# Patient Record
Sex: Male | Born: 1975 | Race: White | Hispanic: No | Marital: Single | Smoking: Former smoker
Health system: Southern US, Community
[De-identification: ages and names within clinical notes are randomized; demographics above are authoritative.]

---

## 2011-12-22 ENCOUNTER — Encounter (HOSPITAL_BASED_OUTPATIENT_CLINIC_OR_DEPARTMENT_OTHER): Payer: Self-pay | Admitting: *Deleted

## 2011-12-22 ENCOUNTER — Emergency Department (HOSPITAL_BASED_OUTPATIENT_CLINIC_OR_DEPARTMENT_OTHER)
Admission: EM | Admit: 2011-12-22 | Discharge: 2011-12-22 | Disposition: A | Discharge status: Home / Self Care | Payer: Self-pay | Attending: Emergency Medicine | Admitting: Emergency Medicine

## 2011-12-22 ENCOUNTER — Emergency Department (HOSPITAL_BASED_OUTPATIENT_CLINIC_OR_DEPARTMENT_OTHER): Payer: Self-pay

## 2011-12-22 DIAGNOSIS — L03221 Cellulitis of neck: Secondary | ICD-10-CM

## 2011-12-22 DIAGNOSIS — E119 Type 2 diabetes mellitus without complications: Secondary | ICD-10-CM | POA: Insufficient documentation

## 2011-12-22 DIAGNOSIS — L0211 Cutaneous abscess of neck: Secondary | ICD-10-CM | POA: Insufficient documentation

## 2011-12-22 DIAGNOSIS — F17200 Nicotine dependence, unspecified, uncomplicated: Secondary | ICD-10-CM | POA: Insufficient documentation

## 2011-12-22 LAB — CBC WITH DIFFERENTIAL/PLATELET
Basophils Absolute: 0 10*3/uL (ref 0.0–0.1)
Basophils Relative: 0 % (ref 0–1)
Eosinophils Absolute: 0.4 10*3/uL (ref 0.0–0.7)
Eosinophils Relative: 7 % — ABNORMAL HIGH (ref 0–5)
HCT: 43.1 % (ref 39.0–52.0)
Hemoglobin: 15.9 g/dL (ref 13.0–17.0)
MCH: 30.3 pg (ref 26.0–34.0)
MCHC: 36.9 g/dL — ABNORMAL HIGH (ref 30.0–36.0)
MCV: 82.3 fL (ref 78.0–100.0)
Monocytes Absolute: 0.4 10*3/uL (ref 0.1–1.0)
Monocytes Relative: 7 % (ref 3–12)
RDW: 11.9 % (ref 11.5–15.5)

## 2011-12-22 LAB — BASIC METABOLIC PANEL
BUN: 15 mg/dL (ref 6–23)
Calcium: 9.8 mg/dL (ref 8.4–10.5)
Chloride: 97 mEq/L (ref 96–112)
Creatinine, Ser: 0.9 mg/dL (ref 0.50–1.35)
GFR calc Af Amer: >90 mL/min (ref >90)
GFR calc non Af Amer: >90 mL/min (ref >90)

## 2011-12-22 MED ORDER — OXYCODONE-ACETAMINOPHEN 5-325 MG PO TABS
2.0000 | ORAL_TABLET | Freq: Once | ORAL | Status: AC
  Administered 2011-12-22: 2 via ORAL
  Filled 2011-12-22 (×2): qty 2

## 2011-12-22 MED ORDER — HYDROMORPHONE HCL PF 1 MG/ML IJ SOLN
1.0000 mg | Freq: Once | INTRAMUSCULAR | Status: AC
  Administered 2011-12-22: 1 mg via INTRAVENOUS
  Filled 2011-12-22: qty 1

## 2011-12-22 MED ORDER — CLINDAMYCIN HCL 150 MG PO CAPS: 150.0000 mg | ORAL_CAPSULE | Freq: Four times a day (QID) | ORAL | Status: DC

## 2011-12-22 MED ORDER — OXYCODONE-ACETAMINOPHEN 5-325 MG PO TABS: 2.0000 | ORAL_TABLET | ORAL | Status: DC | PRN

## 2011-12-22 MED ORDER — CLINDAMYCIN PHOSPHATE 600 MG/50ML IV SOLN
600.0000 mg | Freq: Once | INTRAVENOUS | Status: AC
  Administered 2011-12-22: 600 mg via INTRAVENOUS
  Filled 2011-12-22: qty 50

## 2011-12-22 MED ORDER — LIDOCAINE HCL 2 % IJ SOLN
INTRAMUSCULAR | Status: AC
  Filled 2011-12-22: qty 20

## 2011-12-22 NOTE — ED Notes (Signed)
The I & D tray is setup at bedside and ready for the doctor to use.

## 2011-12-22 NOTE — ED Provider Notes (Signed)
History     CSN: [id number]  Arrival date & time 12/22/11  1348   First MD Initiated Contact with Patient 12/22/11 1531      Chief Complaint  Patient presents with  . Abscess    (Consider location/radiation/quality/duration/timing/severity/associated sxs/prior treatment) Patient is a 36 y.o. male presenting with rash. The history is provided by the patient. No language interpreter was used.  Rash  This is a new problem. There has been no fever. The pain is at a severity of 8/10. The pain is moderate. The pain has been constant since onset. Associated symptoms include pain. He has tried nothing for the symptoms. The treatment provided moderate relief.  Pt complains of a large swollen area to right neck.  Pt was seen in ED in [place] and had I and d.  Pt is on bactrim.   Pt reports increased swelling.  Pt is diabetic  Past Medical History  Diagnosis Date  . Diabetes mellitus     History reviewed. No pertinent past surgical history.  History reviewed. No pertinent family history.  History  Substance Use Topics  . Smoking status: Current Every Day Smoker  . Smokeless tobacco: Not on file  . Alcohol Use: Yes      Review of Systems  Skin: Positive for wound.  All other systems reviewed and are negative.    Allergies  Review of patient's allergies indicates no known allergies.  Home Medications   Current Outpatient Rx  Name Route Sig Dispense Refill  . INSULIN INFUSION PUMP DEVI Does not apply 1.35 Units by Does not apply route every hour as needed.     . SULFAMETHOXAZOLE-TRIMETHOPRIM 800-160 MG PO TABS Oral Take 1 tablet by mouth 2 (two) times daily.      BP 124/78  Pulse 84  Temp 97.5 F (36.4 C) (Oral)  Resp 20  Ht 6\' 3"  (1.905 m)  Wt 159 lb (72.122 kg)  BMI 19.87 kg/m2  SpO2 100%  Physical Exam  Nursing note and vitals reviewed. Constitutional: He is oriented to person, place, and time. He appears well-developed and well-nourished.  HENT:  Head:  Normocephalic and atraumatic.  Right Ear: External ear normal.  Left Ear: External ear normal.  Nose: Nose normal.  Mouth/Throat: Oropharynx is clear and moist.  Eyes: Conjunctivae normal and EOM are normal. Pupils are equal, round, and reactive to light.  Neck:       3cm swollen red area right neck  Cardiovascular: Normal rate and normal heart sounds.   Pulmonary/Chest: Effort normal and breath sounds normal.  Musculoskeletal: Normal range of motion.  Lymphadenopathy:    He has no cervical adenopathy.  Neurological: He is alert and oriented to person, place, and time. He has normal reflexes.  Skin: Skin is warm.  Psychiatric: He has a normal mood and affect.    ED Course  INCISION AND DRAINAGE Date/Time: 12/22/2011 8:26 PM Performed by: Elson Areas Authorized by: Elson Areas Consent: Verbal consent obtained. Risks and benefits: risks, benefits and alternatives were discussed Consent given by: patient Patient understanding: patient states understanding of the procedure being performed Required items: required blood products, implants, devices, and special equipment available Patient identity confirmed: verbally with patient Time out: Immediately prior to procedure a "time out" was called to verify the correct patient, procedure, equipment, support staff and site/side marked as required. Type: abscess Anesthesia: local infiltration Risk factor: underlying major vessel Scalpel size: 11 Incision type: single straight Complexity: simple Patient tolerance: Patient tolerated the procedure  well with no immediate complications. Comments: I did I and D with ultrasound,  No drainage.   Dr. Rhunette Croft made a larger incision with no drainge.  Packing placed   (including critical care time)  Labs Reviewed  GLUCOSE, CAPILLARY - Abnormal; Notable for the following:    Glucose-Capillary 252 (*)     All other components within normal limits  CBC WITH DIFFERENTIAL - Abnormal; Notable  for the following:    MCHC 36.9 (*)     Eosinophils Relative 7 (*)     All other components within normal limits  BASIC METABOLIC PANEL - Abnormal; Notable for the following:    Glucose, Bld 183 (*)     All other components within normal limits   US Soft Tissue Head/neck  12/22/2011  *RADIOLOGY REPORT*  Clinical Data: 36 year old male with focal swelling in the superficial right lateral neck could.  Attempted incision and drainage without fluid or pus identified.  ULTRASOUND OF HEAD/NECK SOFT TISSUES  Technique:  Ultrasound examination of the head and neck soft tissues was performed in the area of clinical concern.  Comparison:  None  Findings: Targeted evaluation of the lateral right neck in the area of clinical concern demonstrates a superficial heterogeneous area measuring approximately 1.3 x 2.5 x 2 cm.  There is no evidence of focal collections/fluid.  An area of shadowing in the midst of this heterogeneity is compatible with gauze placed following I and D.  IMPRESSION: Superficial heterogeneous area in the lateral right neck - likely representing focal inflammation or infection.  No evidence of focal abscess.  Clinical follow-up is recommended to insure resolution after treatment.   Original Report Authenticated By: Rosendo Gros, M.D.      No diagnosis found.    MDM  Ultrasound reviewed,  Pt given Iv clindamycin        Lonia Skinner [place], [place] 12/22/11 2029

## 2011-12-22 NOTE — ED Notes (Signed)
Pt states that both children have had MRSA and he has had a place on his neck for 1-1/2 weeks. Seen Monday and area was lanced. Placed on Septra, but getting worse.

## 2011-12-23 ENCOUNTER — Encounter (HOSPITAL_BASED_OUTPATIENT_CLINIC_OR_DEPARTMENT_OTHER): Payer: Self-pay | Admitting: *Deleted

## 2011-12-23 ENCOUNTER — Emergency Department (HOSPITAL_BASED_OUTPATIENT_CLINIC_OR_DEPARTMENT_OTHER)
Admission: EM | Admit: 2011-12-23 | Discharge: 2011-12-23 | Disposition: A | Discharge status: Home / Self Care | Payer: Self-pay | Attending: Emergency Medicine | Admitting: Emergency Medicine

## 2011-12-23 DIAGNOSIS — L0889 Other specified local infections of the skin and subcutaneous tissue: Secondary | ICD-10-CM | POA: Insufficient documentation

## 2011-12-23 DIAGNOSIS — Z794 Long term (current) use of insulin: Secondary | ICD-10-CM | POA: Insufficient documentation

## 2011-12-23 DIAGNOSIS — L089 Local infection of the skin and subcutaneous tissue, unspecified: Secondary | ICD-10-CM

## 2011-12-23 DIAGNOSIS — F17200 Nicotine dependence, unspecified, uncomplicated: Secondary | ICD-10-CM | POA: Insufficient documentation

## 2011-12-23 DIAGNOSIS — E119 Type 2 diabetes mellitus without complications: Secondary | ICD-10-CM | POA: Insufficient documentation

## 2011-12-23 MED ORDER — OXYCODONE-ACETAMINOPHEN 5-325 MG PO TABS: 1.0000 | ORAL_TABLET | ORAL | Status: AC | PRN

## 2011-12-23 MED ORDER — SODIUM CHLORIDE 0.9 % IV SOLN
Freq: Once | INTRAVENOUS | Status: AC
  Administered 2011-12-23: 22:00:00 via INTRAVENOUS

## 2011-12-23 MED ORDER — CLINDAMYCIN PHOSPHATE 600 MG/50ML IV SOLN
600.0000 mg | Freq: Once | INTRAVENOUS | Status: AC
  Administered 2011-12-23: 600 mg via INTRAVENOUS
  Filled 2011-12-23: qty 50

## 2011-12-23 MED ORDER — CLINDAMYCIN HCL 150 MG PO CAPS: 150.0000 mg | ORAL_CAPSULE | Freq: Four times a day (QID) | ORAL | Status: AC

## 2011-12-23 NOTE — ED Provider Notes (Signed)
Medical screening examination/treatment/procedure(s) were performed by non-physician practitioner and as supervising physician I was immediately available for consultation/collaboration.  Diabetic patient comes in with neck swell, redness, and purulent discharge at home. Post i&d by Ms. Clydie Braun, i performed i&d that was slightly deeper. Bloody discharge only. [place] thereafter is equivocal. Pt has no insurance, and preferred coming back to the ED tomorrow for wound check and ivab over admission to [facility] CDU.  __INCISION AND DRAINAGE Date/Time: 12/22/2011 5:26 PM Performed by: Derwood Kaplan Authorized by: Derwood Kaplan Consent: Verbal consent obtained. Consent given by: patient Patient understanding: patient states understanding of the procedure being performed Site marked: the operative site was marked Imaging studies: imaging studies available Patient identity confirmed: verbally with patient Time out: Immediately prior to procedure a "time out" was called to verify the correct patient, procedure, equipment, support staff and site/side marked as required. Type: abscess Body area: head/neck Anesthesia: local infiltration Local anesthetic: lidocaine 1% with epinephrine Anesthetic total: 5 ml Patient sedated: no Risk factor: underlying major vessel Scalpel size: 11 Needle gauge: 20 Incision type: single straight Complexity: simple Drainage: bloody Drainage amount: moderate Wound treatment: drain placed Packing material: 1/2 in iodoform gauze Patient tolerance: Patient tolerated the procedure well with no immediate complications.  Derwood Kaplan, MD 12/23/11 [phone]

## 2011-12-23 NOTE — ED Notes (Signed)
Pt seen here last p.m. Told to return for recheck. Still tender to touch.

## 2011-12-23 NOTE — ED Provider Notes (Signed)
Patient presented with rx for clindamycin and percocet unable to fill as not signed.  Reprinted and signed by me.   Hilario Quarry, MD 12/23/11 1134

## 2011-12-24 NOTE — ED Provider Notes (Signed)
History     CSN: [id number]  Arrival date & time 12/23/11  1825   First MD Initiated Contact with Patient 12/23/11 2048      Chief Complaint  Patient presents with  . Wound Check    (Consider location/radiation/quality/duration/timing/severity/associated sxs/prior treatment) Patient is a 36 y.o. male presenting with wound check. The history is provided by the patient. No language interpreter was used.  Wound Check  He was treated in the ED today. There has been no treatment since the wound repair. His temperature was unmeasured prior to arrival. There has been bloody discharge from the wound. The redness has improved. The swelling has improved. The pain has improved.   Pt here for recheck.   Pt reports he still has pain.  No fever Past Medical History  Diagnosis Date  . Diabetes mellitus     History reviewed. No pertinent past surgical history.  History reviewed. No pertinent family history.  History  Substance Use Topics  . Smoking status: Current Every Day Smoker  . Smokeless tobacco: Not on file  . Alcohol Use: Yes      Review of Systems  Skin: Positive for wound.  All other systems reviewed and are negative.    Allergies  Review of patient's allergies indicates no known allergies.  Home Medications   Current Outpatient Rx  Name Route Sig Dispense Refill  . CLINDAMYCIN HCL 150 MG PO CAPS Oral Take 1 capsule (150 mg total) by mouth every 6 (six) hours. 28 capsule 0  . INSULIN INFUSION PUMP DEVI Does not apply 1.35 Units by Does not apply route every hour as needed.     . OXYCODONE-ACETAMINOPHEN 5-325 MG PO TABS Oral Take 1 tablet by mouth every 4 (four) hours as needed for pain. 15 tablet 0  . SULFAMETHOXAZOLE-TRIMETHOPRIM 800-160 MG PO TABS Oral Take 1 tablet by mouth 2 (two) times daily.      BP 107/65  Pulse 73  Temp 98.3 F (36.8 C) (Oral)  Resp 18  Ht 6\' 3"  (1.905 m)  Wt 159 lb (72.122 kg)  BMI 19.87 kg/m2  SpO2 98%  Physical Exam  Nursing  note and vitals reviewed. Constitutional: He appears well-developed and well-nourished.  Musculoskeletal: He exhibits tenderness.       Decreased redness, decreased swelling to righrt neck.    Neurological: He is alert.  Skin: There is erythema.  Psychiatric: He has a normal mood and affect.    ED Course  Procedures (including critical care time)  Labs Reviewed - No data to display US Soft Tissue Head/neck  12/22/2011  *RADIOLOGY REPORT*  Clinical Data: 36 year old male with focal swelling in the superficial right lateral neck could.  Attempted incision and drainage without fluid or pus identified.  ULTRASOUND OF HEAD/NECK SOFT TISSUES  Technique:  Ultrasound examination of the head and neck soft tissues was performed in the area of clinical concern.  Comparison:  None  Findings: Targeted evaluation of the lateral right neck in the area of clinical concern demonstrates a superficial heterogeneous area measuring approximately 1.3 x 2.5 x 2 cm.  There is no evidence of focal collections/fluid.  An area of shadowing in the midst of this heterogeneity is compatible with gauze placed following I and D.  IMPRESSION: Superficial heterogeneous area in the lateral right neck - likely representing focal inflammation or infection.  No evidence of focal abscess.  Clinical follow-up is recommended to insure resolution after treatment.   Original Report Authenticated By: Rosendo Gros, M.D.  1. Skin infection       MDM  I removed packing,  Pt given Iv clindamycin.    I gave pt local referrals.   I advised recheck in 1 week.        Lonia Skinner [place], [place] 12/24/11 Rich Fuchs

## 2011-12-24 NOTE — ED Provider Notes (Signed)
Medical screening examination/treatment/procedure(s) were performed by non-physician practitioner and as supervising physician I was immediately available for consultation/collaboration.   Tobin Chad, MD 12/24/11 [phone]

## 2013-01-19 ENCOUNTER — Ambulatory Visit: Payer: Self-pay | Admitting: Internal Medicine

## 2013-01-19 DIAGNOSIS — Z0289 Encounter for other administrative examinations: Secondary | ICD-10-CM

## 2013-02-08 ENCOUNTER — Encounter (HOSPITAL_COMMUNITY): Payer: Self-pay | Admitting: Emergency Medicine

## 2013-02-08 ENCOUNTER — Emergency Department (HOSPITAL_COMMUNITY)
Admission: EM | Admit: 2013-02-08 | Discharge: 2013-02-08 | Disposition: A | Discharge status: Home / Self Care | Payer: Self-pay | Attending: Emergency Medicine | Admitting: Emergency Medicine

## 2013-02-08 DIAGNOSIS — R110 Nausea: Secondary | ICD-10-CM | POA: Insufficient documentation

## 2013-02-08 DIAGNOSIS — F1410 Cocaine abuse, uncomplicated: Secondary | ICD-10-CM | POA: Insufficient documentation

## 2013-02-08 DIAGNOSIS — Z792 Long term (current) use of antibiotics: Secondary | ICD-10-CM | POA: Insufficient documentation

## 2013-02-08 DIAGNOSIS — R4182 Altered mental status, unspecified: Secondary | ICD-10-CM | POA: Insufficient documentation

## 2013-02-08 DIAGNOSIS — F17200 Nicotine dependence, unspecified, uncomplicated: Secondary | ICD-10-CM | POA: Insufficient documentation

## 2013-02-08 DIAGNOSIS — F19929 Other psychoactive substance use, unspecified with intoxication, unspecified: Secondary | ICD-10-CM

## 2013-02-08 DIAGNOSIS — Z794 Long term (current) use of insulin: Secondary | ICD-10-CM | POA: Insufficient documentation

## 2013-02-08 DIAGNOSIS — F1510 Other stimulant abuse, uncomplicated: Secondary | ICD-10-CM | POA: Insufficient documentation

## 2013-02-08 DIAGNOSIS — R000 Tachycardia, unspecified: Secondary | ICD-10-CM | POA: Insufficient documentation

## 2013-02-08 DIAGNOSIS — E119 Type 2 diabetes mellitus without complications: Secondary | ICD-10-CM | POA: Insufficient documentation

## 2013-02-08 LAB — URINALYSIS, ROUTINE W REFLEX MICROSCOPIC
Bilirubin Urine: NEGATIVE
Ketones, ur: 15 mg/dL — AB
Nitrite: NEGATIVE
Protein, ur: NEGATIVE mg/dL
pH: 6 (ref 5.0–8.0)

## 2013-02-08 LAB — HEPATIC FUNCTION PANEL
Albumin: 4.8 g/dL (ref 3.5–5.2)
Total Protein: 7.4 g/dL (ref 6.0–8.3)

## 2013-02-08 LAB — CBC WITH DIFFERENTIAL/PLATELET
Basophils Absolute: 0 10*3/uL (ref 0.0–0.1)
Eosinophils Absolute: 0.1 10*3/uL (ref 0.0–0.7)
Eosinophils Relative: 1 % (ref 0–5)
MCH: 30.6 pg (ref 26.0–34.0)
MCV: 86.3 fL (ref 78.0–100.0)
Platelets: 124 10*3/uL — ABNORMAL LOW (ref 150–400)
RDW: 12.6 % (ref 11.5–15.5)
WBC: 10.5 10*3/uL (ref 4.0–10.5)

## 2013-02-08 LAB — BASIC METABOLIC PANEL
Calcium: 10 mg/dL (ref 8.4–10.5)
GFR calc Af Amer: >90 mL/min (ref >90)
GFR calc non Af Amer: >90 mL/min (ref >90)
Sodium: 137 mEq/L (ref 135–145)

## 2013-02-08 LAB — RAPID URINE DRUG SCREEN, HOSP PERFORMED
Barbiturates: NOT DETECTED
Benzodiazepines: NOT DETECTED

## 2013-02-08 LAB — GLUCOSE, CAPILLARY: Glucose-Capillary: 422 mg/dL — ABNORMAL HIGH (ref 70–99)

## 2013-02-08 MED ORDER — SODIUM CHLORIDE 0.9 % IV BOLUS (SEPSIS)
1000.0000 mL | Freq: Once | INTRAVENOUS | Status: AC
  Administered 2013-02-08: 1000 mL via INTRAVENOUS

## 2013-02-08 MED ORDER — INSULIN ASPART 100 UNIT/ML ~~LOC~~ SOLN
5.0000 [IU] | Freq: Once | SUBCUTANEOUS | Status: AC
  Administered 2013-02-08: 5 [IU] via SUBCUTANEOUS
  Filled 2013-02-08: qty 1

## 2013-02-08 MED ORDER — LORAZEPAM 2 MG/ML IJ SOLN
1.0000 mg | Freq: Once | INTRAMUSCULAR | Status: AC
  Administered 2013-02-08: 1 mg via INTRAVENOUS
  Filled 2013-02-08 (×2): qty 1

## 2013-02-08 NOTE — ED Provider Notes (Addendum)
CSN: [id number]     Arrival date & time 02/08/13  1809 History   First MD Initiated Contact with Patient 02/08/13 1821     Chief Complaint  Patient presents with  . 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  He is able to report that he took two pills of ecstasy. Remainder of history is difficult to obtain.    Patient is a 37 y.o. male presenting with Overdose.  Drug Overdose This is a new problem. Episode onset: reportedly 6 hours ago at 1pm. The problem occurs constantly. The problem has not changed since onset.Associated symptoms comments: Nausea, altered mental status. Nothing aggravates the symptoms. Nothing relieves the symptoms.    Past Medical History  Diagnosis Date  . Diabetes mellitus    History reviewed. No pertinent past surgical history. History reviewed. No pertinent family history. History  Substance Use Topics  . Smoking status: Current Every Day Smoker  . Smokeless tobacco: Not on file  . Alcohol Use: Yes    Review of Systems  Unable to perform ROS: Mental status change    Allergies  Review of patient's allergies indicates no known allergies.  Home Medications   Current Outpatient Rx  Name  Route  Sig  Dispense  Refill  . Insulin Infusion Pump DEVI   Does not apply   1.35 Units by Does not apply route every hour as needed.          . sulfamethoxazole-trimethoprim (BACTRIM DS,SEPTRA DS) 800-160 MG per tablet   Oral   Take 1 tablet by mouth 2 (two) times daily.          BP 120/60  Pulse 89  Resp 22  SpO2 100% Physical Exam  Nursing note and vitals reviewed. Constitutional: He is oriented to person, place, and time. He appears well-developed and well-nourished. No distress.  HENT:  Head: Normocephalic and atraumatic.  Mouth/Throat: Oropharynx is  clear and moist.  Eyes: Conjunctivae are normal. Pupils are equal, round, and reactive to light. No scleral icterus.  Neck: Neck supple.  Cardiovascular: Regular rhythm, normal heart sounds and intact distal pulses.  Tachycardia present.   No murmur heard. Pulmonary/Chest: Effort normal and breath sounds normal. No stridor. No respiratory distress. He has no wheezes. He has no rales.  Abdominal: Soft. He exhibits no distension. There is no tenderness.  Musculoskeletal: Normal range of motion. He exhibits no edema.  Neurological: He is alert and oriented to person, place, and time.  Moves all extremities.    Skin: Skin is warm and dry. No rash noted.  Psychiatric: His affect is labile.    ED Course  Procedures (including critical care time) Labs Review Labs Reviewed  URINALYSIS, ROUTINE W REFLEX MICROSCOPIC - Abnormal; Notable for the following:    Glucose, UA >1000 (*)    Ketones, ur 15 (*)    All other components within normal limits  URINE RAPID DRUG SCREEN (HOSP PERFORMED) - Abnormal; Notable for the following:    Cocaine POSITIVE (*)    All other components within normal limits  GLUCOSE, CAPILLARY - Abnormal; Notable for the following:    Glucose-Capillary 422 (*)    All other components within normal limits  CBC WITH DIFFERENTIAL - Abnormal; Notable for the following:    Platelets 124 (*)    Neutrophils Relative % 88 (*)    Neutro Abs 9.3 (*)  Lymphocytes Relative 6 (*)    All other components within normal limits  BASIC METABOLIC PANEL - Abnormal; Notable for the following:    Chloride 95 (*)    Glucose, Bld 433 (*)    All other components within normal limits  GLUCOSE, CAPILLARY - Abnormal; Notable for the following:    Glucose-Capillary 458 (*)    All other components within normal limits  GLUCOSE, CAPILLARY - Abnormal; Notable for the following:    Glucose-Capillary 329 (*)    All other components within normal limits  URINE MICROSCOPIC-ADD ON  HEPATIC FUNCTION  PANEL   Imaging Review No results found.  EKG Interpretation     Ventricular Rate:  102 PR Interval:  155 QRS Duration: 95 QT Interval:  356 QTC Calculation: 464 R Axis:   80 Text Interpretation:  Sinus tachycardia Baseline wander in lead(s) V3 V4 V5 V6 No old tracing to compare            MDM   1. Acute drug intoxication    37 year old male presenting after ingesting 2 tablets of ecstasy.  He presented with altered mental status and vomiting.  After monitoring for several hours, his mental status returned to baseline, his vomiting was controlled with a single dose of ativan.  His family reported depression, but pt denied SI or intent to hurt himself.  He pulled out his insulin pump during his intoxication, which is likely explanation for hyperglycemia.  He was given SQ insulin and fluid boluses and his insulin pump was reconnected.  He will continue to manage his glucose at home.  Will give resources for substance abuse.      Candyce Churn, MD 02/08/13 6644  Candyce Churn, MD 02/08/13 [phone]

## 2013-02-08 NOTE — ED Notes (Signed)
Mother - Mingo Amber [phone]

## 2013-02-08 NOTE — ED Notes (Addendum)
Pt denies SI/HI. Mother states pt has been depressed lately because of family issues. She also states he has been expressing thoughts of harming self but has not attempted anything. Mother states one day he is fine and the next day he is depressed. She states he has a lot of anger built up inside. She thinks he needs help as far as that and detox from heroin.

## 2013-02-08 NOTE — ED Notes (Signed)
Per EMS, Pt presents with drug intoxication.  Pt sts "I took ecstasy.  One green [place] and one red [place]. I know, I overdosed.  I just want this high to be over."  Pt sts he took the medication around 1300.  Pt is, also, a Type I diabetic and pulled out his insulin pump.  Denies pain.  NAD noted.  Pt is currently calm and cooperative.  Vitals are stable.

## 2014-01-07 IMAGING — US US SOFT TISSUE HEAD/NECK
1 series · 13 of 13 positions shown · non-contrast
Comparison: None

CLINICAL DATA: 35-year-old male with focal swelling in the
superficial right lateral neck could.  Attempted incision and
drainage without fluid or pus identified.

ULTRASOUND OF HEAD/NECK SOFT TISSUES
TECHNIQUE: Ultrasound examination of the head and neck soft
tissues was performed in the area of clinical concern.

[Series 1: us soft tissue head/neck · 0.05mm/px · 13 acquisitions, 13 frames shown]
[im 1/13]
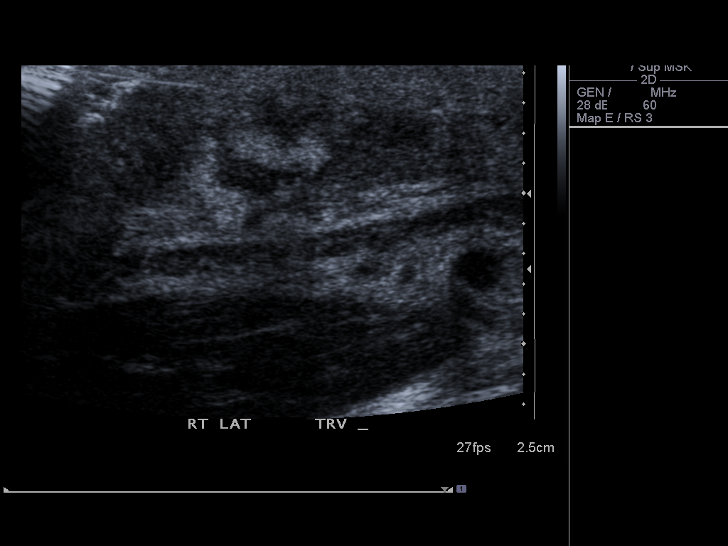
[im 2/13]
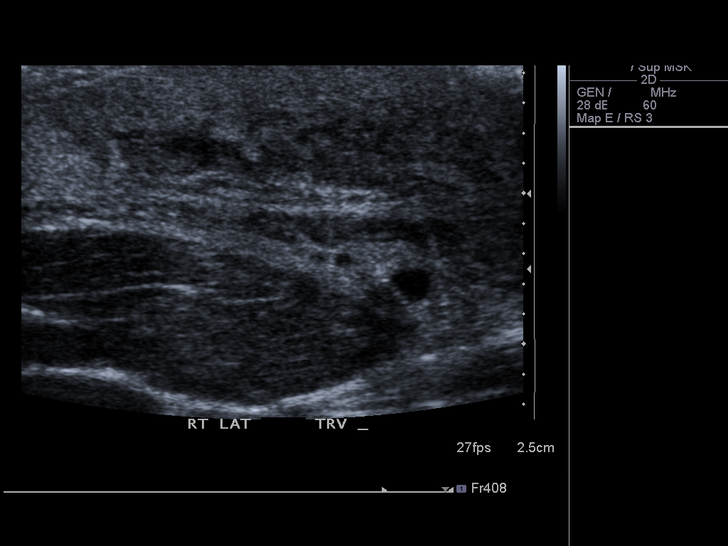
[im 3/13]
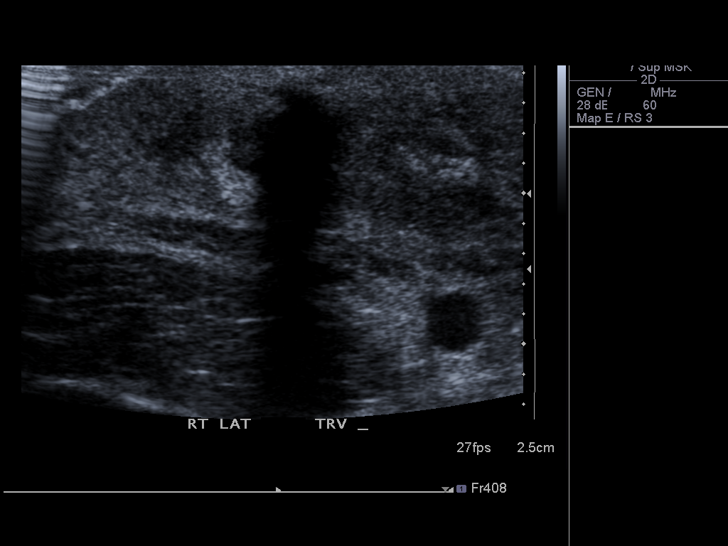
[im 4/13]
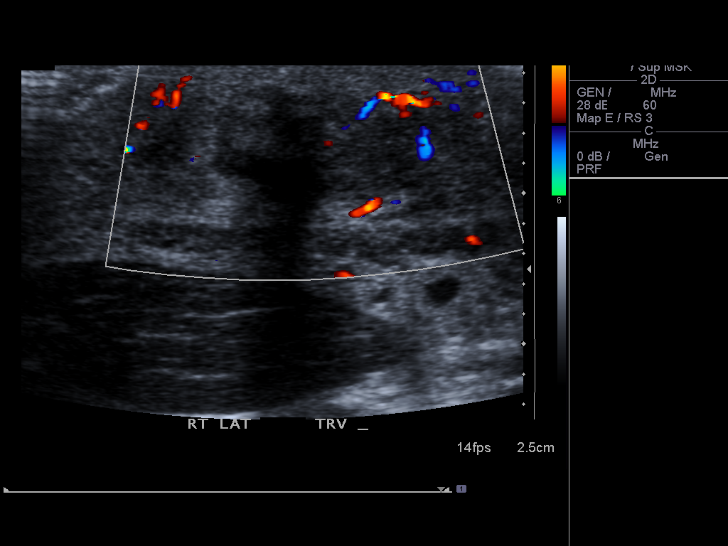
[im 5/13]
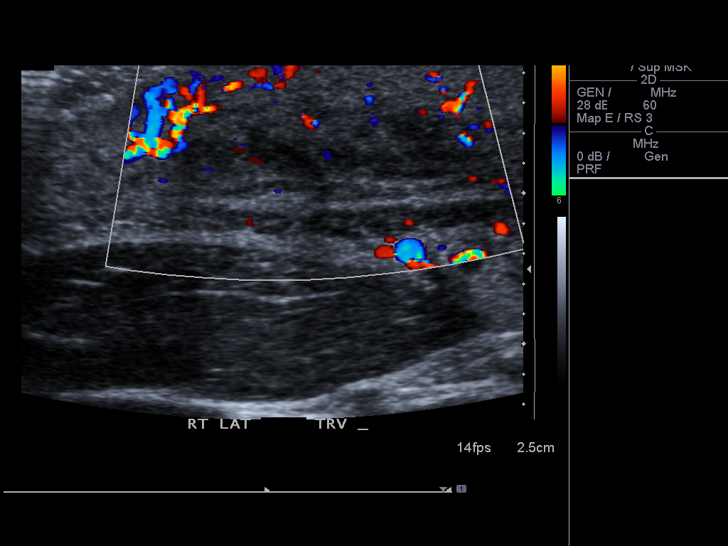
[im 6/13]
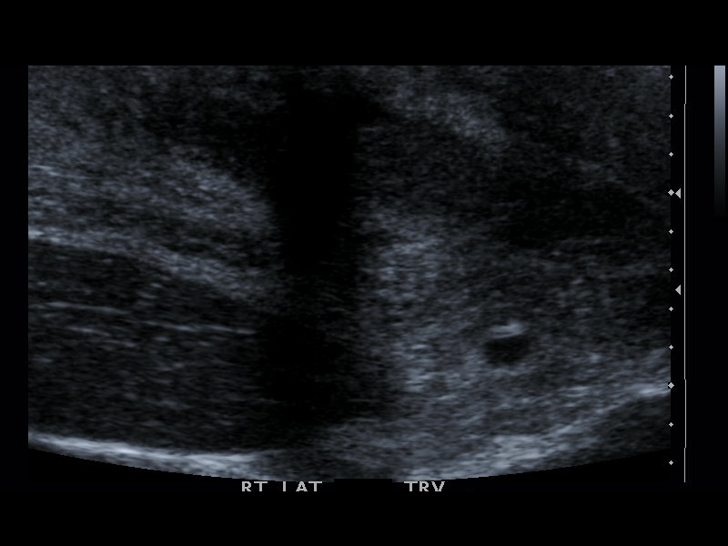
[im 7/13]
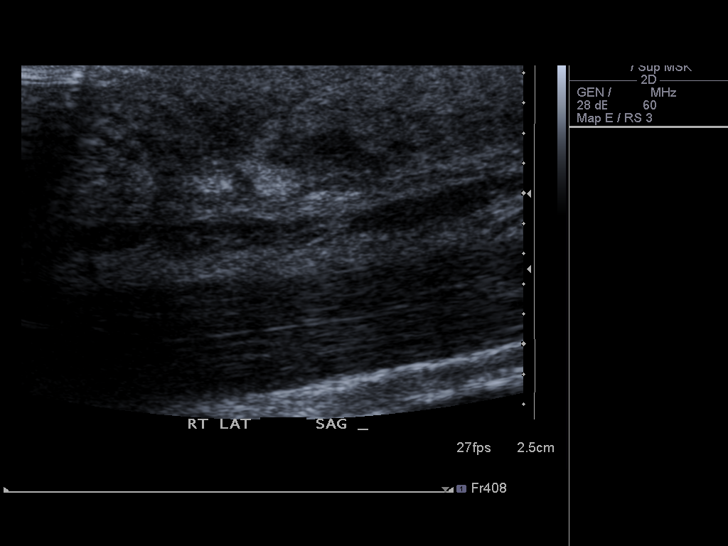
[im 8/13]
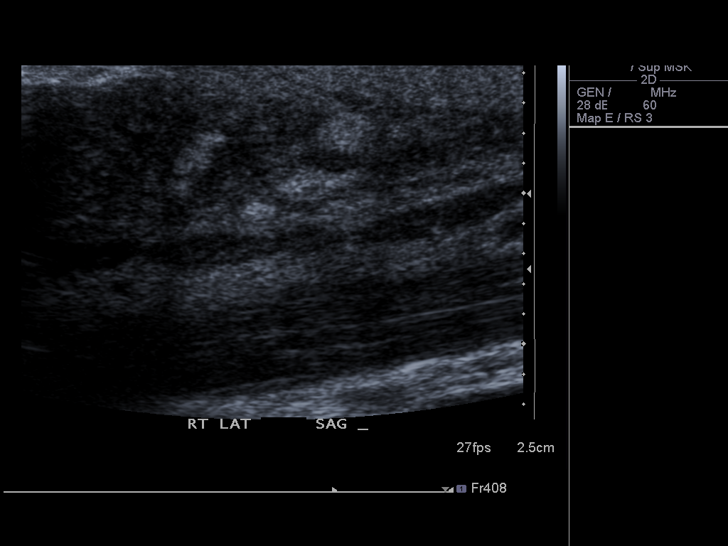
[im 9/13]
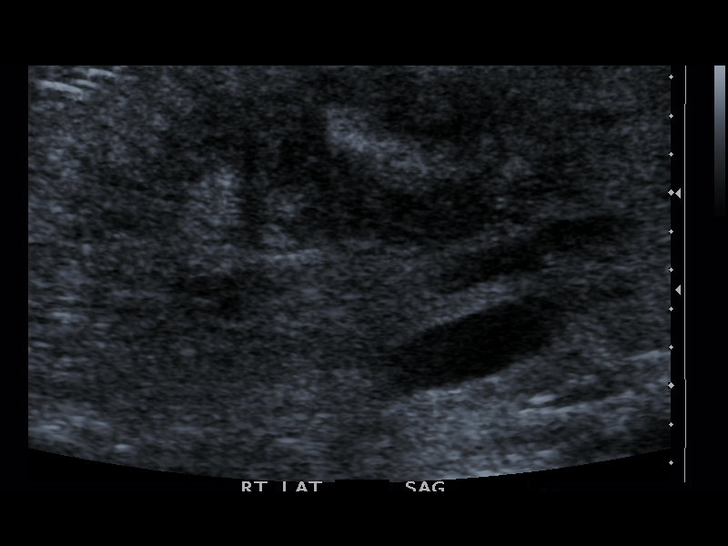
[im 10/13]
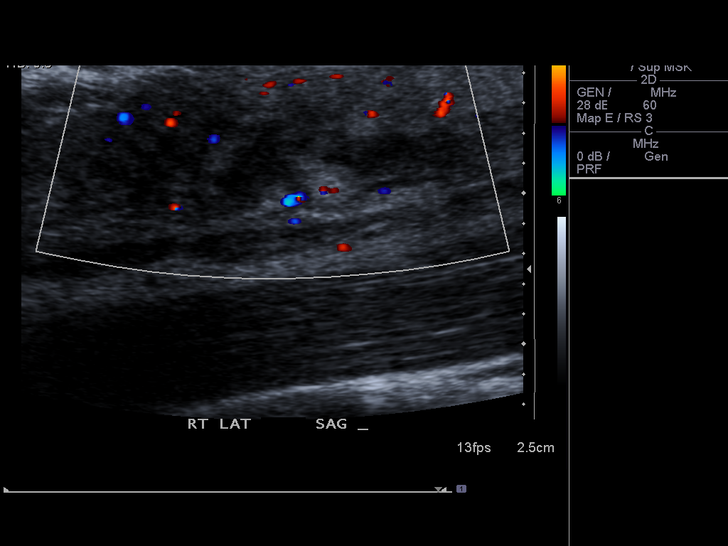
[im 11/13]
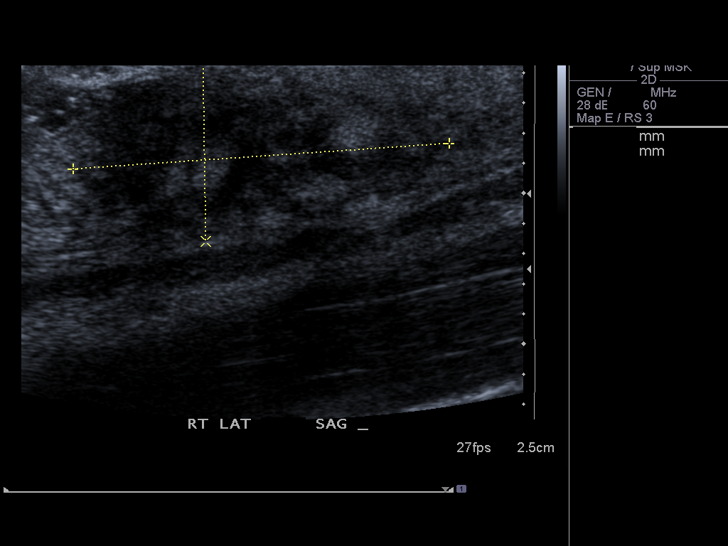
[im 12/13]
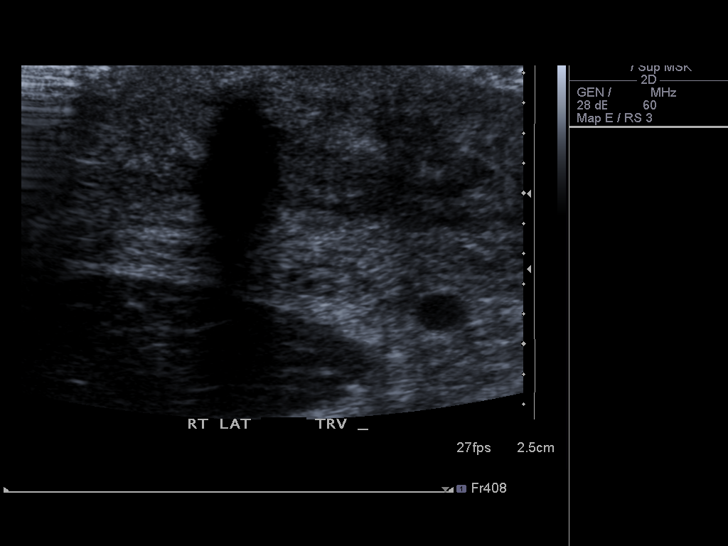
[im 13/13]
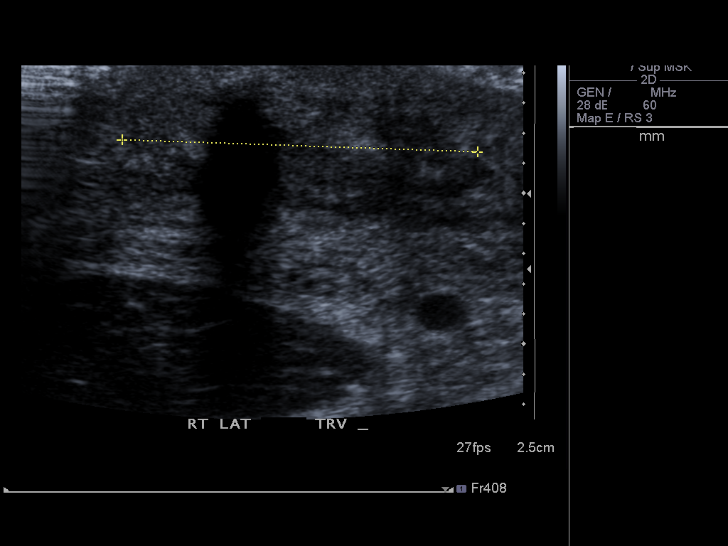

[13 of 13 positions shown; findings below may reference images not displayed]

FINDINGS: Targeted evaluation of the lateral right neck in the area
of clinical concern demonstrates a superficial heterogeneous area
measuring approximately 1.3 x 2.5 x 2 cm.  There is no evidence of
focal collections/fluid.  An area of shadowing in the midst of this
heterogeneity is compatible with gauze placed following I and D.
IMPRESSION: Superficial heterogeneous area in the lateral right neck - likely
representing focal inflammation or infection.  No evidence of focal
abscess.  Clinical follow-up is recommended to insure resolution
after treatment.

## 2018-10-03 ENCOUNTER — Other Ambulatory Visit: Payer: Self-pay

## 2018-10-03 ENCOUNTER — Encounter (HOSPITAL_BASED_OUTPATIENT_CLINIC_OR_DEPARTMENT_OTHER): Payer: Self-pay | Admitting: *Deleted

## 2018-10-03 ENCOUNTER — Emergency Department (HOSPITAL_BASED_OUTPATIENT_CLINIC_OR_DEPARTMENT_OTHER)
Admission: EM | Admit: 2018-10-03 | Discharge: 2018-10-03 | Disposition: A | Discharge status: Home / Self Care | Payer: Medicaid Other | Attending: Emergency Medicine | Admitting: Emergency Medicine

## 2018-10-03 DIAGNOSIS — F17200 Nicotine dependence, unspecified, uncomplicated: Secondary | ICD-10-CM | POA: Insufficient documentation

## 2018-10-03 DIAGNOSIS — E119 Type 2 diabetes mellitus without complications: Secondary | ICD-10-CM | POA: Diagnosis not present

## 2018-10-03 DIAGNOSIS — K0889 Other specified disorders of teeth and supporting structures: Secondary | ICD-10-CM | POA: Insufficient documentation

## 2018-10-03 DIAGNOSIS — Z794 Long term (current) use of insulin: Secondary | ICD-10-CM | POA: Diagnosis not present

## 2018-10-03 MED ORDER — CLINDAMYCIN HCL 150 MG PO CAPS: 150.0000 mg | ORAL_CAPSULE | Freq: Four times a day (QID) | ORAL | 0 refills | Status: DC

## 2018-10-03 MED FILL — CLINDAMYCIN HCL 150 MG CAPS: 150 | 7 days supply | Qty: 28 | Fill #0

## 2018-10-03 NOTE — ED Triage Notes (Signed)
Tooth to right upper chipped 8 month ago and in 3 months, half of the chipped tooth broke that cause the swelling on his face 3 days ago.

## 2018-10-05 NOTE — ED Provider Notes (Signed)
[place] EMERGENCY DEPARTMENT Provider Note   CSN: [id number] Arrival date & time: 10/03/18  1206     History   Chief Complaint Chief Complaint  Patient presents with  . Tooth Problem    HPI Patrick Khan is a 43 y.o. male.     HPI   43 year old male with pain to right upper tooth.  He chipped it several months ago.  Began having pain in the last several days to weeks.  Mild swelling.  No fevers or chills.  No difficulty breathing or swallowing.  Reports upcoming dentist appointment.  Past Medical History:  Diagnosis Date  . Diabetes mellitus     There are no active problems to display for this patient.   History reviewed. No pertinent surgical history.      Home Medications    Prior to Admission medications   Medication Sig Start Date End Date Taking? Authorizing Provider  amoxicillin-clavulanate (AUGMENTIN) 875-125 MG tablet Take 1 tablet by mouth 2 (two) times daily.   Yes [provider]  Insulin Human (INSULIN PUMP) 100 unit/ml SOLN Inject 1 each into the skin continuous. 1.35 units per hour; Brand is [employer]   Yes [provider]  naproxen (NAPROSYN) 500 MG tablet Take 500 mg by mouth 2 (two) times daily with a meal.   Yes [provider]  clindamycin (CLEOCIN) 150 MG capsule Take 1 capsule (150 mg total) by mouth 4 (four) times daily. 10/03/18   Virgel Manifold, MD  ibuprofen (ADVIL,MOTRIN) 200 MG tablet Take 200 mg by mouth every 6 (six) hours as needed for pain.    [provider]    Family History History reviewed. No pertinent family history.  Social History Social History   Tobacco Use  . Smoking status: Current Every Day Smoker  . Smokeless tobacco: Never Used  Substance Use Topics  . Alcohol use: Yes    Comment: occasonally  . Drug use: Yes    Comment: ecstasy in 2014; not at present 2020     Allergies   Acetaminophen   Review of Systems Review of Systems  All systems reviewed and  negative, other than as noted in HPI.  Physical Exam Updated Vital Signs BP (!) 132/98 (BP Location: Right Arm)   Pulse 84   Temp 98.8 F (37.1 C) (Oral)   Resp 16   Ht 6\' 3"  (1.905 m)   Wt 83.9 kg   SpO2 96%   BMI 23.12 kg/m   Physical Exam Vitals signs and nursing note reviewed.  Constitutional:      General: He is not in acute distress.    Appearance: He is well-developed.  HENT:     Head: Normocephalic and atraumatic.     Comments: Right upper premolar is cracked.  No drainable collection.  No facial swelling appreciated.  Normal sounding voice.  Neck supple. Eyes:     General:        Right eye: No discharge.        Left eye: No discharge.     Conjunctiva/sclera: Conjunctivae normal.  Neck:     Musculoskeletal: Neck supple.  Cardiovascular:     Rate and Rhythm: Normal rate and regular rhythm.     Heart sounds: Normal heart sounds. No murmur. No friction rub. No gallop.   Pulmonary:     Effort: Pulmonary effort is normal. No respiratory distress.     Breath sounds: Normal breath sounds.  Abdominal:     General: There is no distension.  Palpations: Abdomen is soft.     Tenderness: There is no abdominal tenderness.  Musculoskeletal:        General: No tenderness.  Skin:    General: Skin is warm and dry.  Neurological:     Mental Status: He is alert.  Psychiatric:        Behavior: Behavior normal.        Thought Content: Thought content normal.      ED Treatments / Results  Labs (all labs ordered are listed, but only abnormal results are displayed) Labs Reviewed - No data to display  EKG    Radiology No results found.  Procedures Procedures (including critical care time)  Medications Ordered in ED Medications - No data to display   Initial Impression / Assessment and Plan / ED Course  I have reviewed the triage vital signs and the nursing notes.  Pertinent labs & imaging results that were available during my care of the patient were  reviewed by me and considered in my medical decision making (see chart for details).        Dental pain.  No overt signs of infection, will but will place on antibiotics.  Needs definitive dental follow-up.  Return precautions discussed.  Final Clinical Impressions(s) / ED Diagnoses   Final diagnoses:  Pain, dental    ED Discharge Orders         Ordered    clindamycin (CLEOCIN) 150 MG capsule  4 times daily     10/03/18 1318           Raeford RazorKohut, Murad Staples, MD 10/05/18 2156

## 2019-08-16 ENCOUNTER — Emergency Department (HOSPITAL_BASED_OUTPATIENT_CLINIC_OR_DEPARTMENT_OTHER)
Admission: EM | Admit: 2019-08-16 | Discharge: 2019-08-16 | Disposition: A | Discharge status: Home / Self Care | Payer: Medicaid Other | Attending: Emergency Medicine | Admitting: Emergency Medicine

## 2019-08-16 ENCOUNTER — Encounter (HOSPITAL_BASED_OUTPATIENT_CLINIC_OR_DEPARTMENT_OTHER): Payer: Self-pay

## 2019-08-16 ENCOUNTER — Other Ambulatory Visit: Payer: Self-pay

## 2019-08-16 DIAGNOSIS — L03311 Cellulitis of abdominal wall: Secondary | ICD-10-CM | POA: Diagnosis present

## 2019-08-16 DIAGNOSIS — F17200 Nicotine dependence, unspecified, uncomplicated: Secondary | ICD-10-CM | POA: Insufficient documentation

## 2019-08-16 DIAGNOSIS — Z79899 Other long term (current) drug therapy: Secondary | ICD-10-CM | POA: Diagnosis not present

## 2019-08-16 DIAGNOSIS — Z794 Long term (current) use of insulin: Secondary | ICD-10-CM | POA: Insufficient documentation

## 2019-08-16 DIAGNOSIS — Z9641 Presence of insulin pump (external) (internal): Secondary | ICD-10-CM | POA: Insufficient documentation

## 2019-08-16 DIAGNOSIS — E119 Type 2 diabetes mellitus without complications: Secondary | ICD-10-CM | POA: Diagnosis not present

## 2019-08-16 MED ORDER — DOXYCYCLINE HYCLATE 100 MG PO CAPS: 100.0000 mg | ORAL_CAPSULE | Freq: Two times a day (BID) | ORAL | 0 refills | Status: DC

## 2019-08-16 NOTE — ED Triage Notes (Signed)
Pt states he has an abscess at an insulin injection site x 5 days. 2 days ago pt cut it open himself with a "new" razor blade. Pt states the site is getting worse.

## 2019-08-16 NOTE — ED Provider Notes (Signed)
[facility] Provider Note   CSN: [id number] Arrival date & time: 08/16/19  1510     History Chief Complaint  Patient presents with  . Recurrent Skin Infections    Patrick Khan is a 44 y.o. male.  HPI Patient reports 5 days ago he started getting a tender nodule on his abdominal wall where his insulin pump was inserted.  It got increasingly large and sore with pus in it.  Reports this is the first time he is ever got an infection where his insulin pump was inserted.  Patient was out of town and could not get into his doctor so 2 days ago he got new razors and alcohol and prepped the area and I&D did.  He reports copious amount of pus came out.  He reports now the area is just hard with some redness around it but it is smaller than it was before he I&D did.  He wanted to come in and see if he should be on antibiotics as well.  No fevers no chills.  He reports his blood sugars are tightly controlled.  He has not had any seriously high readings.    Past Medical History:  Diagnosis Date  . Diabetes mellitus     There are no problems to display for this patient.   History reviewed. No pertinent surgical history.     No family history on file.  Social History   Tobacco Use  . Smoking status: Current Some Day Smoker  . Smokeless tobacco: Never Used  Substance Use Topics  . Alcohol use: Not Currently    Comment: occasonally  . Drug use: Not Currently    Comment: ecstasy in 2014; not at present 2020    Home Medications Prior to Admission medications   Medication Sig Start Date End Date Taking? Authorizing Provider  amoxicillin-clavulanate (AUGMENTIN) 875-125 MG tablet Take 1 tablet by mouth 2 (two) times daily.    [provider]  clindamycin (CLEOCIN) 150 MG capsule Take 1 capsule (150 mg total) by mouth 4 (four) times daily. 10/03/18   Virgel Manifold, MD  doxycycline (VIBRAMYCIN) 100 MG capsule Take 1 capsule (100 mg total) by mouth  2 (two) times daily. One po bid x 7 days 08/16/19   Charlesetta Shanks, MD  ibuprofen (ADVIL,MOTRIN) 200 MG tablet Take 200 mg by mouth every 6 (six) hours as needed for pain.    [provider]  Insulin Human (INSULIN PUMP) 100 unit/ml SOLN Inject 1 each into the skin continuous. 1.35 units per hour; Brand is Medtronic    [provider]  naproxen (NAPROSYN) 500 MG tablet Take 500 mg by mouth 2 (two) times daily with a meal.    [provider]    Allergies    Acetaminophen  Review of Systems   Review of Systems Constitutional: No fever no chills no malaise GI: No nausea vomiting abdominal pain. Endocrine: No sweats chills Physical Exam Updated Vital Signs BP (!) 155/95   Pulse 91   Temp 98.2 F (36.8 C) (Oral)   Ht 6\' 3"  (1.905 m)   Wt 90.7 kg   SpO2 100%   BMI 25.00 kg/m   Physical Exam Constitutional:      Appearance: Normal appearance.  Eyes:     Extraocular Movements: Extraocular movements intact.  Pulmonary:     Effort: Pulmonary effort is normal.  Abdominal:     Comments: Is soft without guarding.  Patient has focal induration approximately 5 cm with  a central wet eschar.  Surrounding erythema consistent with cellulitis.  No fluctuance.  See attached images.  Musculoskeletal:        General: No swelling. Normal range of motion.     Right lower leg: No edema.     Left lower leg: No edema.  Skin:    General: Skin is warm and dry.  Neurological:     General: No focal deficit present.     Mental Status: He is alert and oriented to person, place, and time.     Coordination: Coordination normal.  Psychiatric:        Mood and Affect: Mood normal.       ED Results / Procedures / Treatments   Labs (all labs ordered are listed, but only abnormal results are displayed) Labs Reviewed - No data to display  EKG None  Radiology No results found.  Procedures Procedures (including critical care time)  Medications Ordered in ED Medications  - No data to display  ED Course  I have reviewed the triage vital signs and the nursing notes.  Pertinent labs & imaging results that were available during my care of the patient were reviewed by me and considered in my medical decision making (see chart for details).    MDM Rules/Calculators/A&P                     Patient has abdominal wall cellulitis.  There is firm induration of site of prior abscess.  At this time no drainable collection.  Patient is diabetic with an insulin pump.  Blood sugars have been controlled and patient is not experiencing fever or constitutional symptoms to suggest bacteremia.  At this time, no additional lab work needed.  Strict return precautions have been reviewed.  Patient will follow up with his PCP for recheck in the next 2 to 3 days.  Will initiate doxycycline. Final Clinical Impression(s) / ED Diagnoses Final diagnoses:  Cellulitis of abdominal wall    Rx / DC Orders ED Discharge Orders         Ordered    doxycycline (VIBRAMYCIN) 100 MG capsule  2 times daily     08/16/19 1601           Arby Barrette, MD 08/16/19 1606

## 2021-01-02 ENCOUNTER — Other Ambulatory Visit: Payer: Self-pay

## 2021-01-02 ENCOUNTER — Emergency Department (HOSPITAL_BASED_OUTPATIENT_CLINIC_OR_DEPARTMENT_OTHER): Payer: Medicaid Other

## 2021-01-02 ENCOUNTER — Encounter (HOSPITAL_BASED_OUTPATIENT_CLINIC_OR_DEPARTMENT_OTHER): Payer: Self-pay | Admitting: *Deleted

## 2021-01-02 ENCOUNTER — Emergency Department (HOSPITAL_BASED_OUTPATIENT_CLINIC_OR_DEPARTMENT_OTHER)
Admission: EM | Admit: 2021-01-02 | Discharge: 2021-01-02 | Disposition: A | Discharge status: Home / Self Care | Payer: Medicaid Other | Attending: Emergency Medicine | Admitting: Emergency Medicine

## 2021-01-02 DIAGNOSIS — E109 Type 1 diabetes mellitus without complications: Secondary | ICD-10-CM | POA: Insufficient documentation

## 2021-01-02 DIAGNOSIS — F17200 Nicotine dependence, unspecified, uncomplicated: Secondary | ICD-10-CM | POA: Diagnosis not present

## 2021-01-02 DIAGNOSIS — N410 Acute prostatitis: Secondary | ICD-10-CM | POA: Diagnosis not present

## 2021-01-02 DIAGNOSIS — N50819 Testicular pain, unspecified: Secondary | ICD-10-CM | POA: Diagnosis present

## 2021-01-02 DIAGNOSIS — Z794 Long term (current) use of insulin: Secondary | ICD-10-CM | POA: Diagnosis not present

## 2021-01-02 LAB — URINALYSIS, MICROSCOPIC (REFLEX)

## 2021-01-02 LAB — URINALYSIS, ROUTINE W REFLEX MICROSCOPIC
Bilirubin Urine: NEGATIVE
Glucose, UA: >=500 mg/dL — AB
Hgb urine dipstick: NEGATIVE
Ketones, ur: NEGATIVE mg/dL
Leukocytes,Ua: NEGATIVE
Nitrite: NEGATIVE
Protein, ur: NEGATIVE mg/dL
Specific Gravity, Urine: 1.025 (ref 1.005–1.030)
pH: 6 (ref 5.0–8.0)

## 2021-01-02 MED ORDER — OXYCODONE HCL 5 MG PO TABS
5.0000 mg | ORAL_TABLET | Freq: Four times a day (QID) | ORAL | 0 refills | Status: DC | PRN
Start: 2021-01-02 — End: 2021-01-02

## 2021-01-02 MED ORDER — SULFAMETHOXAZOLE-TRIMETHOPRIM 800-160 MG PO TABS: 1.0000 | ORAL_TABLET | Freq: Two times a day (BID) | ORAL | 0 refills | Status: AC

## 2021-01-02 MED ORDER — OXYCODONE HCL 5 MG PO TABS: 5.0000 mg | ORAL_TABLET | Freq: Four times a day (QID) | ORAL | 0 refills | Status: AC | PRN

## 2021-01-02 MED ORDER — OXYCODONE HCL 5 MG PO TABS: 5.0000 mg | ORAL_TABLET | Freq: Once | ORAL | Status: DC

## 2021-01-02 MED ORDER — SULFAMETHOXAZOLE-TRIMETHOPRIM 800-160 MG PO TABS: 1.0000 | ORAL_TABLET | Freq: Two times a day (BID) | ORAL | 0 refills | Status: DC

## 2021-01-02 NOTE — ED Triage Notes (Signed)
C/o right scrotum pain and swelling x 1 week

## 2021-01-02 NOTE — ED Provider Notes (Signed)
[facility] EMERGENCY DEPARTMENT Provider Note   CSN: [id number] Arrival date & time: 01/02/21  1429     History Chief Complaint  Patient presents with   Testicle Pain    Patrick Khan is a 45 y.o. male.  Patient with history of type 1 diabetes presents with perineal pain.  States that same began 1 week ago after working out.  States the pain is burning in nature and that he has a history of same 15 years ago, was diagnosed with prostatitis and given an antibiotic for treatment.  Denies urinary symptoms, abdominal pain, changes in bowel habits, fevers, chills.  The history is provided by the patient. No language interpreter was used.  Testicle Pain Pertinent negatives include no abdominal pain and no headaches.      Past Medical History:  Diagnosis Date   Diabetes mellitus     There are no problems to display for this patient.   History reviewed. No pertinent surgical history.     No family history on file.  Social History   Tobacco Use   Smoking status: Some Days   Smokeless tobacco: Never  Vaping Use   Vaping Use: Every day  Substance Use Topics   Alcohol use: Not Currently    Comment: occasonally   Drug use: Not Currently    Comment: ecstasy in 2014; not at present 2020    Home Medications Prior to Admission medications   Medication Sig Start Date End Date Taking? Authorizing Provider  amoxicillin-clavulanate (AUGMENTIN) 875-125 MG tablet Take 1 tablet by mouth 2 (two) times daily.    [provider]  clindamycin (CLEOCIN) 150 MG capsule Take 1 capsule (150 mg total) by mouth 4 (four) times daily. 10/03/18   Raeford Razor, MD  doxycycline (VIBRAMYCIN) 100 MG capsule Take 1 capsule (100 mg total) by mouth 2 (two) times daily. One po bid x 7 days 08/16/19   Arby Barrette, MD  ibuprofen (ADVIL,MOTRIN) 200 MG tablet Take 200 mg by mouth every 6 (six) hours as needed for pain.    [provider]  Insulin Human (INSULIN PUMP) 100  unit/ml SOLN Inject 1 each into the skin continuous. 1.35 units per hour; Brand is Medtronic    [provider]  naproxen (NAPROSYN) 500 MG tablet Take 500 mg by mouth 2 (two) times daily with a meal.    [provider]    Allergies    Acetaminophen  Review of Systems   Review of Systems  Constitutional:  Negative for chills, diaphoresis, fatigue and fever.  Gastrointestinal:  Positive for rectal pain. Negative for abdominal distention, abdominal pain, anal bleeding, blood in stool, constipation, diarrhea, nausea and vomiting.  Genitourinary:  Positive for testicular pain. Negative for decreased urine volume, difficulty urinating, dysuria, enuresis, flank pain, frequency, genital sores, hematuria, penile discharge, penile pain, penile swelling, scrotal swelling and urgency.  Musculoskeletal:  Negative for back pain.  Skin:  Negative for color change, pallor, rash and wound.  Neurological:  Negative for dizziness, tremors, seizures, syncope, facial asymmetry, speech difficulty, weakness, light-headedness, numbness and headaches.  Psychiatric/Behavioral:  Negative for confusion and decreased concentration.   All other systems reviewed and are negative.  Physical Exam Updated Vital Signs BP (!) 159/104   Pulse 91   Temp 98.9 F (37.2 C) (Oral)   Resp 16   Ht 6\' 4"  (1.93 m)   Wt 90.7 kg   SpO2 98%   BMI 24.34 kg/m   Physical Exam Vitals and nursing note  reviewed. Exam conducted with a chaperone present Jodi Geralds, PA-C present for rectal and testicular exam).  Constitutional:      General: He is not in acute distress.    Appearance: Normal appearance. He is normal weight. He is not ill-appearing, toxic-appearing or diaphoretic.  HENT:     Head: Normocephalic and atraumatic.  Cardiovascular:     Rate and Rhythm: Normal rate and regular rhythm.     Heart sounds: Normal heart sounds.  Pulmonary:     Effort: Pulmonary effort is normal. No respiratory distress.      Breath sounds: Normal breath sounds.  Abdominal:     General: Abdomen is flat. Bowel sounds are normal. There is no distension.     Palpations: Abdomen is soft. There is no mass.     Tenderness: There is no abdominal tenderness. There is no right CVA tenderness, left CVA tenderness, guarding or rebound.     Hernia: No hernia is present.  Genitourinary:    Penis: Normal.      Testes: Normal.     Comments: Unable to tolerate rectal exam due to significant discomfort. Penile and testicular exam unremarkable for any abnormalities. Tenderness noted to perineal area without rashes, lesion, abscess Musculoskeletal:        General: Normal range of motion.     Cervical back: Normal range of motion.  Skin:    General: Skin is warm and dry.  Neurological:     General: No focal deficit present.     Mental Status: He is alert.  Psychiatric:        Mood and Affect: Mood normal.        Behavior: Behavior normal.    ED Results / Procedures / Treatments   Labs (all labs ordered are listed, but only abnormal results are displayed) Labs Reviewed  URINALYSIS, ROUTINE W REFLEX MICROSCOPIC - Abnormal; Notable for the following components:      Result Value   Glucose, UA >=500 (*)    All other components within normal limits  URINALYSIS, MICROSCOPIC (REFLEX) - Abnormal; Notable for the following components:   Bacteria, UA RARE (*)    All other components within normal limits  URINE CULTURE    EKG None  Radiology US SCROTUM W/DOPPLER  Result Date: 01/02/2021 CLINICAL DATA:  Right testicular pain for 7 days EXAM: SCROTAL ULTRASOUND DOPPLER ULTRASOUND OF THE TESTICLES TECHNIQUE: Complete ultrasound examination of the testicles, epididymis, and other scrotal structures was performed. Color and spectral Doppler ultrasound were also utilized to evaluate blood flow to the testicles. COMPARISON:  None. FINDINGS: Right testicle Measurements: 4.5 x 2.4 x 2.7 cm. No mass or microlithiasis visualized.  Left testicle Measurements: 4.8 x 2.5 x 3.1 cm. No mass or microlithiasis visualized. Right epididymis: Multiple simple appearing right epididymal cysts are seen, largest measuring 11 mm. Left epididymis:  Normal in size and appearance. Hydrocele:  None visualized. Varicocele:  None visualized. Pulsed Doppler interrogation of both testes demonstrates normal low resistance arterial and venous waveforms bilaterally. IMPRESSION: 1. Multiple small simple appearing right epididymal cysts, largest measuring 11 mm. These are of uncertain clinical significance. 2. Otherwise unremarkable testicular ultrasound. Electronically Signed   By: Sharlet Salina M.D.   On: 01/02/2021 15:45    Procedures Procedures   Medications Ordered in ED Medications - No data to display  ED Course  I have reviewed the triage vital signs and the nursing notes.  Pertinent labs & imaging results that were available during my care of the patient  were reviewed by me and considered in my medical decision making (see chart for details).    MDM Rules/Calculators/A&P                         Patient presents today with perineal pain.  Denies any urinary symptoms or abdominal pain.  States that he was diagnosed with prostatitis approximately 15 years ago and feels the pain now is similar to symptoms then.  He is unable to tolerate rectal exam due to significant pain.  Testicular ultrasound ordered to rule out torsion, negative for same.  No testicular or penile abnormalities noted on exam.  No abscess or other wound noted to perineal area.  Patient is afebrile, nontoxic-appearing, and in no acute distress.  Does endorse significant discomfort and hesitancy with bowel movements due to pain.  Patient denies sexual activity in the last 2 years, no penile discharge or other urinary symptoms.  No abdominal pain or flank pain.  UA yields normal result, however due to patient's history of prostatitis and significant rectal discomfort upon exam,  will treat empirically for prostatitis.  She is not nonseptic appearing and I feel he is fit for discharge.  I have also given pain management given significant discomfort.  Patient is amenable with plan, knows not to drive following administration of pain medication.  Given urology referral for follow-up.  Patient educated on red flag symptoms that would prompt return.  Findings and plan of care discussed with supervising physician Dr. Jacqulyn Bath who is in agreement.    Final Clinical Impression(s) / ED Diagnoses Final diagnoses:  Acute prostatitis    Rx / DC Orders ED Discharge Orders          Ordered    sulfamethoxazole-trimethoprim (BACTRIM DS) 800-160 MG tablet  2 times daily        01/02/21 1654    oxyCODONE (ROXICODONE) 5 MG immediate release tablet  Every 6 hours PRN        01/02/21 1658          An After Visit Summary was printed and given to the patient.    Vear Clock 01/02/21 1715    Maia Plan, MD 01/06/21 1102

## 2021-01-02 NOTE — ED Notes (Signed)
Snacks given to per v.o. Long MD.

## 2021-01-02 NOTE — ED Notes (Signed)
Patient transported to [facility] 

## 2021-01-02 NOTE — Discharge Instructions (Addendum)
Take narcotic medication only for severe pain management.  Do not drive after taking this medication.  You may also take ibuprofen for additional pain management.  I have also prescribed Bactrim for prostatitis.  Please take the entire dose of this as prescribed.  Follow-up with urology for continued symptom management.  Return if symptoms worsen.

## 2021-01-03 LAB — URINE CULTURE: Culture: NO GROWTH

## 2021-01-27 ENCOUNTER — Encounter (HOSPITAL_BASED_OUTPATIENT_CLINIC_OR_DEPARTMENT_OTHER): Payer: Self-pay

## 2021-01-27 ENCOUNTER — Emergency Department (HOSPITAL_BASED_OUTPATIENT_CLINIC_OR_DEPARTMENT_OTHER)
Admission: EM | Admit: 2021-01-27 | Discharge: 2021-01-27 | Discharge status: Against Medical Advice | Payer: Medicaid Other | Attending: Emergency Medicine | Admitting: Emergency Medicine

## 2021-01-27 ENCOUNTER — Other Ambulatory Visit: Payer: Self-pay

## 2021-01-27 DIAGNOSIS — Z5321 Procedure and treatment not carried out due to patient leaving prior to being seen by health care provider: Secondary | ICD-10-CM | POA: Diagnosis not present

## 2021-01-27 DIAGNOSIS — R102 Pelvic and perineal pain: Secondary | ICD-10-CM | POA: Diagnosis present

## 2021-01-27 NOTE — ED Triage Notes (Signed)
Pt c/o groin pain states he was seen here and "specialist" for same-states pain is worse-steady gait, groaning
